# Patient Record
Sex: Female | Born: 1970 | Race: White | Hispanic: Yes | Marital: Single | Smoking: Never smoker
Health system: Southern US, Community
[De-identification: ages and names within clinical notes are randomized; demographics above are authoritative.]

## PROBLEM LIST (undated history)

## (undated) DIAGNOSIS — D649 Anemia, unspecified: Secondary | ICD-10-CM

## (undated) DIAGNOSIS — F209 Schizophrenia, unspecified: Secondary | ICD-10-CM

---

## 2012-02-06 ENCOUNTER — Emergency Department: Payer: Self-pay | Admitting: Emergency Medicine

## 2012-02-06 LAB — SALICYLATE LEVEL: Salicylates, Serum: <1.7 mg/dL

## 2012-02-06 LAB — DRUG SCREEN, URINE
Cannabinoid 50 Ng, Ur ~~LOC~~: NEGATIVE (ref ?–50)
Cocaine Metabolite,Ur ~~LOC~~: NEGATIVE (ref ?–300)
MDMA (Ecstasy)Ur Screen: NEGATIVE (ref ?–500)
Opiate, Ur Screen: NEGATIVE (ref ?–300)

## 2012-02-06 LAB — COMPREHENSIVE METABOLIC PANEL
Albumin: 4 g/dL (ref 3.4–5.0)
Alkaline Phosphatase: 71 U/L (ref 50–136)
Anion Gap: 10 (ref 7–16)
BUN: 14 mg/dL (ref 7–18)
Co2: 20 mmol/L — ABNORMAL LOW (ref 21–32)
Creatinine: 0.66 mg/dL (ref 0.60–1.30)
SGPT (ALT): 26 U/L (ref 12–78)
Sodium: 141 mmol/L (ref 136–145)

## 2012-02-06 LAB — CBC
HCT: 31 % — ABNORMAL LOW (ref 35.0–47.0)
HGB: 10.3 g/dL — ABNORMAL LOW (ref 12.0–16.0)
MCV: 65 fL — ABNORMAL LOW (ref 80–100)
RDW: 16.2 % — ABNORMAL HIGH (ref 11.5–14.5)
WBC: 8.3 10*3/uL (ref 3.6–11.0)

## 2012-02-06 LAB — ETHANOL: Ethanol %: <0.003 % (ref 0.000–0.080)

## 2012-02-07 ENCOUNTER — Emergency Department (HOSPITAL_COMMUNITY)
Admission: EM | Admit: 2012-02-07 | Discharge: 2012-02-08 | Disposition: A | Discharge status: Home / Self Care | Payer: Self-pay | Attending: Psychiatry | Admitting: Psychiatry

## 2012-02-07 ENCOUNTER — Encounter (HOSPITAL_COMMUNITY): Payer: Self-pay

## 2012-02-07 DIAGNOSIS — Z79899 Other long term (current) drug therapy: Secondary | ICD-10-CM | POA: Insufficient documentation

## 2012-02-07 DIAGNOSIS — F209 Schizophrenia, unspecified: Secondary | ICD-10-CM | POA: Diagnosis present

## 2012-02-07 DIAGNOSIS — D649 Anemia, unspecified: Secondary | ICD-10-CM | POA: Insufficient documentation

## 2012-02-07 HISTORY — DX: Schizophrenia, unspecified: F20.9

## 2012-02-07 HISTORY — DX: Anemia, unspecified: D64.9

## 2012-02-07 LAB — ETHANOL: Alcohol, Ethyl (B): <11 mg/dL (ref 0–11)

## 2012-02-07 LAB — CBC WITH DIFFERENTIAL/PLATELET
Basophils Relative: 0 % (ref 0–1)
Eosinophils Relative: 0 % (ref 0–5)
HCT: 32 % — ABNORMAL LOW (ref 36.0–46.0)
Hemoglobin: 9.9 g/dL — ABNORMAL LOW (ref 12.0–15.0)
Lymphs Abs: 1.8 10*3/uL (ref 0.7–4.0)
MCH: 20.6 pg — ABNORMAL LOW (ref 26.0–34.0)
MCHC: 30.9 g/dL (ref 30.0–36.0)
MCV: 66.5 fL — ABNORMAL LOW (ref 78.0–100.0)
Monocytes Absolute: 0.4 10*3/uL (ref 0.1–1.0)
Neutro Abs: 5.1 10*3/uL (ref 1.7–7.7)
Neutrophils Relative %: 70 % (ref 43–77)
RBC: 4.81 MIL/uL (ref 3.87–5.11)

## 2012-02-07 LAB — COMPREHENSIVE METABOLIC PANEL
ALT: 15 U/L (ref 0–35)
BUN: 13 mg/dL (ref 6–23)
CO2: 20 mEq/L (ref 19–32)
Calcium: 9.4 mg/dL (ref 8.4–10.5)
GFR calc Af Amer: >90 mL/min (ref >90)
GFR calc non Af Amer: >90 mL/min (ref >90)
Glucose, Bld: 100 mg/dL — ABNORMAL HIGH (ref 70–99)
Sodium: 136 mEq/L (ref 135–145)

## 2012-02-07 LAB — RAPID URINE DRUG SCREEN, HOSP PERFORMED
Benzodiazepines: NOT DETECTED
Opiates: NOT DETECTED

## 2012-02-07 MED ORDER — IBUPROFEN 600 MG PO TABS: 600.0000 mg | ORAL_TABLET | Freq: Three times a day (TID) | ORAL | Status: DC | PRN

## 2012-02-07 MED ORDER — ONDANSETRON HCL 4 MG PO TABS: 4.0000 mg | ORAL_TABLET | Freq: Three times a day (TID) | ORAL | Status: DC | PRN

## 2012-02-07 MED ORDER — ACETAMINOPHEN 325 MG PO TABS
650.0000 mg | ORAL_TABLET | ORAL | Status: DC | PRN
  Administered 2012-02-07 – 2012-02-08 (×2): 650 mg via ORAL
  Filled 2012-02-07 (×2): qty 2

## 2012-02-07 MED ORDER — ZOLPIDEM TARTRATE 5 MG PO TABS: 5.0000 mg | ORAL_TABLET | Freq: Every evening | ORAL | Status: DC | PRN

## 2012-02-07 MED ORDER — LORAZEPAM 1 MG PO TABS: 1.0000 mg | ORAL_TABLET | Freq: Three times a day (TID) | ORAL | Status: DC | PRN

## 2012-02-07 NOTE — ED Notes (Signed)
Patient has a history of schizophrenia. Patient is having auditory hallucinations and the voices are telling her to hurt herself and talk to her about suicide. Patient's sister reports that the patient frequently tells lies, having aggressive behavior, and paranoid thoughts. 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 Patient verbally states that she wants to hurt herself, but then she will state she doesn't want to hurt herself in the next sentence.

## 2012-02-07 NOTE — ED Notes (Signed)
Patient's sister-Arasely   (cell) [phone]   (home) [phone]

## 2012-02-07 NOTE — ED Notes (Signed)
Pt states "my sister & her girlfriend tell me I'm a liar, the voices tell me I'm lying, I have a lot of pressure in my head, moved here from [place] 2 months ago, my sister takes my things away when I don't do my chores, my other family doesn't have anything to do with me, my sister and her girlfriend, her girlfriend says 'why are you being good to your sister"

## 2012-02-07 NOTE — ED Provider Notes (Signed)
History      CSN: [id number]  Arrival date & time 02/07/12  1530   First MD Initiated Contact with Patient 02/07/12 1658      Chief Complaint  Patient presents with  . Medical Clearance  . Suicidal    HPI Patient presents because of auditory hallucinations telling her to injure herself and to consider suicide.  Patient's sister is present who reports a long-standing history of paranoid thoughts.  Mild MR.  The patient reports that she wants to herself but states that she doesn't want to die.  The patient is a known history of schizophrenia and anemia.  Her symptoms are mild to moderate in severity.  No other complaints.   Past Medical History  Diagnosis Date  . Schizophrenia   . Anemia     History reviewed. No pertinent past surgical history.  No family history on file.  History  Substance Use Topics  . Smoking status: Never Smoker   . Smokeless tobacco: Not on file  . Alcohol Use: No      Review of Systems  All other systems reviewed and are negative.    Allergies  Review of patient's allergies indicates no known allergies.  Home Medications   Current Outpatient Rx  Name Route Sig Dispense Refill  . ARIPIPRAZOLE 15 MG PO TABS Oral Take 15 mg by mouth daily.    Marland Kitchen CLOZAPINE 25 MG PO TABS Oral Take 25 mg by mouth daily.    Marland Kitchen FLUOXETINE HCL 20 MG PO CAPS Oral Take 20 mg by mouth daily.      BP 108/56  Pulse 107  Temp 98.6 F (37 C) (Oral)  Resp 20  LMP 01/08/2012  Physical Exam  Nursing note and vitals reviewed. Constitutional: She is oriented to person, place, and time. She appears well-developed and well-nourished. No distress.  HENT:  Head: Normocephalic and atraumatic.  Eyes: EOM are normal.  Neck: Normal range of motion.  Cardiovascular: Normal rate, regular rhythm and normal heart sounds.   Pulmonary/Chest: Effort normal and breath sounds normal.  Abdominal: Soft. She exhibits no distension. There is no tenderness.  Musculoskeletal: Normal  range of motion.  Neurological: She is alert and oriented to person, place, and time.  Skin: Skin is warm and dry.  Psychiatric: She has a normal mood and affect. Judgment normal.    ED Course  Procedures (including critical care time)   Labs Reviewed  CBC WITH DIFFERENTIAL - Abnormal; Notable for the following:    Hemoglobin 9.9 (*)     HCT 32.0 (*)     MCV 66.5 (*)     MCH 20.6 (*)     All other components within normal limits  COMPREHENSIVE METABOLIC PANEL - Abnormal; Notable for the following:    Glucose, Bld 100 (*)     Total Bilirubin 0.2 (*)     All other components within normal limits  URINE RAPID DRUG SCREEN (HOSP PERFORMED)  ETHANOL  LAB REPORT - SCANNED   No results found.   1. Schizophrenia       MDM           Lyanne Co, MD 03/01/12 [phone]

## 2012-02-08 DIAGNOSIS — F209 Schizophrenia, unspecified: Secondary | ICD-10-CM

## 2012-02-08 DIAGNOSIS — F70 Mild intellectual disabilities: Secondary | ICD-10-CM | POA: Insufficient documentation

## 2012-02-08 NOTE — ED Notes (Signed)
Pt's sister on phone stating "can I pick her up tomorrow, I just took a sleeping pill"; informed sister after d/c'd needs to be p/u'd, can send her in a taxi & pay upon arrival, sister then stated "I will just come get her"

## 2012-02-08 NOTE — BH Assessment (Signed)
Assessment Note   Tamara Dominguez is an 41 y.o. female. Pt presents with AH of voices telling her to hurt herself. Pt denies currently having AH at time of assessment. Per pt, "I have problems communicating all my thoughts because I am retarded". Pt also stated "I do this with my hands (opening and clenching fists) but I'm not going to hit". Pt denies SI, HI or current AH. Stated "I just want to get into a program where they can take care of me. Like at [facility] explained was part of the [facility] program in [place] where she had a Therapist, sports, therapist and day programs. Pt does not have OPT or support since being her in . Reports that her sister's girlfriend "calls me a liar and won't let me sleep. She wakes me up all the time. She says my sister is too soft on me, but I know my sister loves me, she is my guardian. I have a paper that says she has to take care of me. I feel hurt that my sister's girl is doing this to me." Pt described being set up with in-home services in [place] where an RN that would come to the home to check on her medications, but does not have health care providers here. Stated she has been taking the medications, which help to control the voices. Will need to get additional information from sister, who is apparently legal guardian.  Axis I: Schizophrenia, per pt report/history Axis II: r/o Mild MR - per pt report Axis III:  Past Medical History  Diagnosis Date  . Schizophrenia   . Anemia    Axis IV: other psychosocial or environmental problems, problems with access to health care services and problems with primary support group Axis V: 41-50 serious symptoms  Past Medical History:  Past Medical History  Diagnosis Date  . Schizophrenia   . Anemia     History reviewed. No pertinent past surgical history.  Family History: No family history on file.  Social History:  reports that she has never smoked. She does not have any smokeless tobacco history on file. She reports  that she does not drink alcohol or use illicit drugs.  Additional Social History:  Alcohol / Drug Use Pain Medications: N/A Prescriptions: See PTA Listing Over the Counter: N/A History of alcohol / drug use?: No history of alcohol / drug abuse Longest period of sobriety (when/how long): N/A  CIWA: CIWA-Ar BP: 137/88 mmHg Pulse Rate: 101  COWS:    Allergies: No Known Allergies  Home Medications:  (Not in a hospital admission)  OB/GYN Status:  Patient's last menstrual period was 01/08/2012.  General Assessment Data Location of Assessment: [facility] Living Arrangements: Other relatives;Non-relatives/Friends (Sister & sister's Girlfriend) Can pt return to current living arrangement?: Yes Admission Status: Voluntary Is patient capable of signing voluntary admission?: No (Pt's sister is guardian) Transfer from: Acute Hospital Referral Source: Self/Family/Friend  Education Status Is patient currently in school?: No Contact person: Landscape architect, sister  Risk to self Suicidal Ideation: No-Not Currently/Within Last 6 Months (Pt denies) Suicidal Intent: No-Not Currently/Within Last 6 Months (Pt denies) Is patient at risk for suicide?: No Suicidal Plan?: No-Not Currently/Within Last 6 Months (Pt denies) Access to Means: No What has been your use of drugs/alcohol within the last 12 months?: N/A Previous Attempts/Gestures: No (Pt denies) Other Self Harm Risks: None Triggers for Past Attempts: None known Intentional Self Injurious Behavior: None Family Suicide History: No Recent stressful life event(s): Other (Comment) (Moved from  [place] in August) Persecutory voices/beliefs?: Yes Depression: Yes Depression Symptoms: Fatigue;Feeling angry/irritable Substance abuse history and/or treatment for substance abuse?: No Suicide prevention information given to non-admitted patients: Not applicable  Risk to Others Homicidal Ideation: No-Not Currently/Within Last 6 Months (Pt  denies) Thoughts of Harm to Others: No-Not Currently Present/Within Last 6 Months (Pt denies) Current Homicidal Intent: No-Not Currently/Within Last 6 Months Current Homicidal Plan: No-Not Currently/Within Last 6 Months Access to Homicidal Means: No Identified Victim: N/A History of harm to others?: No Assessment of Violence: None Noted Violent Behavior Description: Calm, cooperative, pleasant Does patient have access to weapons?: No Criminal Charges Pending?: No Does patient have a court date: No  Psychosis Hallucinations: Auditory (denies currently) Delusions: None noted  Mental Status Report Appear/Hygiene: Disheveled Eye Contact: Good Motor Activity: Freedom of movement Speech: Logical/coherent Level of Consciousness: Quiet/awake Mood: Sad Affect: Sad Anxiety Level: Minimal Thought Processes: Coherent;Relevant Judgement: Impaired Orientation: Person;Place;Time;Situation Obsessive Compulsive Thoughts/Behaviors: None  Cognitive Functioning Concentration: Normal Memory: Recent Intact;Remote Intact IQ: Below Average Level of Function:  (Pt reports she is "retarded") Insight: Fair Impulse Control: Fair Appetite: Good Weight Loss: 0  Weight Gain: 0  Sleep: Decreased (states sister's GF wakes her up all the time) Vegetative Symptoms: None  ADLScreening [facility]) Patient's cognitive ability adequate to safely complete daily activities?: Yes Patient able to express need for assistance with ADLs?: Yes Independently performs ADLs?: Yes (appropriate for developmental age)  Abuse/Neglect [facility]) Physical Abuse: Denies Verbal Abuse: Denies Sexual Abuse: Denies  Prior Inpatient Therapy Prior Inpatient Therapy: Yes (pt poor historian)  Prior Outpatient Therapy Prior Outpatient Therapy: Yes Prior Therapy Dates: 2013 prior to move in August Prior Therapy Facilty/Provider(s): [place] - [facility] Reason for Treatment: MMR; Depression  ADL Screening (condition at  time of admission) Patient's cognitive ability adequate to safely complete daily activities?: Yes Patient able to express need for assistance with ADLs?: Yes Independently performs ADLs?: Yes (appropriate for developmental age)       Abuse/Neglect Assessment (Assessment to be complete while patient is alone) Physical Abuse: Denies Verbal Abuse: Denies Sexual Abuse: Denies Exploitation of patient/patient's resources: Denies Self-Neglect: Denies Values / Beliefs Cultural Requests During Hospitalization: None Spiritual Requests During Hospitalization: None   Advance Directives (For Healthcare) Advance Directive: Patient does not have advance directive;Patient would not like information Pre-existing out of facility DNR order (yellow form or pink MOST form): No    Additional Information 1:1 In Past 12 Months?: No CIRT Risk: No Elopement Risk: No Does patient have medical clearance?: Yes     Disposition:     On Site Evaluation by:   Reviewed with Physician:     Romeo Apple 02/08/2012 11:49 AM

## 2012-02-08 NOTE — ED Notes (Signed)
Tamara Dominguez, ACT informed sister is to p/u pt.

## 2012-02-08 NOTE — ED Notes (Signed)
While picking up breakfast tray patient states she feels dizzy. Notified RN, Arboriculturist.

## 2012-02-08 NOTE — ED Notes (Signed)
Pt states "the voices aren't there today, I just feel dizzy when I get up"; informed pt when sitting up to sit up slowly and sit there for a moment; pt verbalized understanding.

## 2012-02-08 NOTE — ED Notes (Signed)
Pt attempting to call for ride

## 2012-02-08 NOTE — BHH Counselor (Signed)
Upon psychiatric recommendation and evaluation, pt is to be discharged with instructions to follow up with community & OPT resources.  Updated EDP who was agreeable with d/c.

## 2012-02-08 NOTE — ED Notes (Signed)
Pt provided with other resources from [place] with ACT.

## 2012-02-08 NOTE — Consult Note (Signed)
Reason for Consult: psychosis Referring Physician: [name] is an 41 y.o. female.  HPI: Patient was seen and chart reviewed. Patient was relocated from [place], [place] to [place], few months ago along with her sister and sisters girlfriend. Patient has been diagnosed with the schizophrenia, and mild mental retardation, and has received psychiatric services as outpatient the past. Patient has been receiving her medications from her previous providers and does not have any local providers. Patient denied current symptoms of for psychosis auditory or visual hallucinations, delusions, or paranoia. She also denied current suicidal ideation, homicidal ideation, intentions and plans. Pt denies currently having AH at time of assessment. Stated "I just want to get into a program where they can take care of me. Like at [facility] explained was part of the [place] program in [place] where she had a Therapist, sports, therapist and day programs. Reports that her sister's girlfriend "calls me a liar and won't let me sleep. Patient sister is her guardian and the who was soft on her. She has been taking the medications, which help to control the voices.   Patient appeared in her bed who is calm, quite cooperative, not in distress. Patient denied the psychotic symptoms with symptoms of depression, anxiety. Patient has no suicidal, homicidal ideation intentions or plans.  Past Medical History  Diagnosis Date  . Schizophrenia   . Anemia     History reviewed. No pertinent past surgical history.  No family history on file.  Social History:  reports that she has never smoked. She does not have any smokeless tobacco history on file. She reports that she does not drink alcohol or use illicit drugs.  Allergies: No Known Allergies  Medications: I have reviewed the patient's current medications.  Results for orders placed during the hospital encounter of 02/07/12 (from the past 48 hour(s))  CBC WITH  DIFFERENTIAL     Status: Abnormal   Collection Time   02/07/12  4:55 PM      Component Value Range Comment   WBC 7.3  4.0 - 10.5 K/uL    RBC 4.81  3.87 - 5.11 MIL/uL    Hemoglobin 9.9 (*) 12.0 - 15.0 g/dL    HCT 16.1 (*) 09.6 - 46.0 %    MCV 66.5 (*) 78.0 - 100.0 fL    MCH 20.6 (*) 26.0 - 34.0 pg    MCHC 30.9  30.0 - 36.0 g/dL    RDW 04.5  40.9 - 81.1 %    Platelets 348  150 - 400 K/uL    Neutrophils Relative 70  43 - 77 %    Lymphocytes Relative 24  12 - 46 %    Monocytes Relative 6  3 - 12 %    Eosinophils Relative 0  0 - 5 %    Basophils Relative 0  0 - 1 %    Neutro Abs 5.1  1.7 - 7.7 K/uL    Lymphs Abs 1.8  0.7 - 4.0 K/uL    Monocytes Absolute 0.4  0.1 - 1.0 K/uL    Eosinophils Absolute 0.0  0.0 - 0.7 K/uL    Basophils Absolute 0.0  0.0 - 0.1 K/uL    Smear Review MORPHOLOGY UNREMARKABLE     COMPREHENSIVE METABOLIC PANEL     Status: Abnormal   Collection Time   02/07/12  4:55 PM      Component Value Range Comment   Sodium 136  135 - 145 mEq/L    Potassium 3.7  3.5 - 5.1 mEq/L    Chloride 103  96 - 112 mEq/L    CO2 20  19 - 32 mEq/L    Glucose, Bld 100 (*) 70 - 99 mg/dL    BUN 13  6 - 23 mg/dL    Creatinine, Ser 1.61  0.50 - 1.10 mg/dL    Calcium 9.4  8.4 - 09.6 mg/dL    Total Protein 7.6  6.0 - 8.3 g/dL    Albumin 3.9  3.5 - 5.2 g/dL    AST 17  0 - 37 U/L    ALT 15  0 - 35 U/L    Alkaline Phosphatase 68  39 - 117 U/L    Total Bilirubin 0.2 (*) 0.3 - 1.2 mg/dL    GFR calc non Af Amer >90  >90 mL/min    GFR calc Af Amer >90  >90 mL/min   ETHANOL     Status: Normal   Collection Time   02/07/12  4:55 PM      Component Value Range Comment   Alcohol, Ethyl (B) <11  0 - 11 mg/dL   URINE RAPID DRUG SCREEN (HOSP PERFORMED)     Status: Normal   Collection Time   02/07/12 10:52 PM      Component Value Range Comment   Opiates NONE DETECTED  NONE DETECTED    Cocaine NONE DETECTED  NONE DETECTED    Benzodiazepines NONE DETECTED  NONE DETECTED    Amphetamines NONE  DETECTED  NONE DETECTED    Tetrahydrocannabinol NONE DETECTED  NONE DETECTED    Barbiturates NONE DETECTED  NONE DETECTED     No results found.  No depression, No anxiety, No psychosis and Positive for Schizophrenia mild mental retardation, recent relocation need referral to the mental health services. Blood pressure 128/91, pulse 116, temperature 98.7 F (37.1 C), temperature source Oral, resp. rate 18, last menstrual period 01/08/2012, SpO2 99.00%.   Assessment/Plan: Schizophrenia and differentiated Mild mental retardation as per the patient report.  Patient does not meet criteria for acute psychiatric hospitalization. Patient will be referred to outpatient psychiatric services with the local resources. No medication changes made during this emergency psychiatric services Visit.  Tamara Dominguez,Tamara R. 02/08/2012, 4:18 PM

## 2012-02-08 NOTE — BHH Suicide Risk Assessment (Signed)
Suicide Risk Assessment  Discharge Assessment     Demographic Factors:  Caucasian, Low socioeconomic status and Unemployed  Mental Status Per Nursing Assessment::   On Admission:     Current Mental Status by Physician: Denies psychotic symptoms, suicidal and homicidal ideation. Endorses conflict with his sister's girlfriend.  Loss Factors: Recent relocation, and the conflict with the sister's girlfriend.  Historical Factors: Family history of mental illness or substance abuse  Risk Reduction Factors:   Sense of responsibility to family, Religious beliefs about death, Living with another person, especially a relative, Positive social support and Positive therapeutic relationship  Continued Clinical Symptoms:  Previous Psychiatric Diagnoses and Treatments Medical Diagnoses and Treatments/Surgeries  Cognitive Features That Contribute To Risk:  Closed-mindedness Loss of executive function    Suicide Risk:  Minimal: No identifiable suicidal ideation.  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 Schizophrenia   . Anemia    AXIS IV:  economic problems, educational problems, other psychosocial or environmental problems, problems related to social environment and problems with primary support group AXIS V:  61-70 mild symptoms  Plan Of Care/Follow-up recommendations:  Activity:  Normal Diet:  Regular  Is patient on multiple antipsychotic therapies at discharge:  No   Has Patient had three or more failed trials of antipsychotic monotherapy by history:  No  Recommended Plan for Multiple Antipsychotic Therapies: Not applicable  Dezi Schaner,[name] 02/08/2012, 4:35 PM

## 2012-03-14 ENCOUNTER — Emergency Department (HOSPITAL_COMMUNITY)
Admission: EM | Admit: 2012-03-14 | Discharge: 2012-03-14 | Disposition: A | Discharge status: Home / Self Care | Payer: Medicare Other | Attending: Emergency Medicine | Admitting: Emergency Medicine

## 2012-03-14 ENCOUNTER — Encounter (HOSPITAL_COMMUNITY): Payer: Self-pay | Admitting: Emergency Medicine

## 2012-03-14 DIAGNOSIS — F3289 Other specified depressive episodes: Secondary | ICD-10-CM | POA: Insufficient documentation

## 2012-03-14 DIAGNOSIS — Z79899 Other long term (current) drug therapy: Secondary | ICD-10-CM | POA: Insufficient documentation

## 2012-03-14 DIAGNOSIS — R443 Hallucinations, unspecified: Secondary | ICD-10-CM | POA: Insufficient documentation

## 2012-03-14 DIAGNOSIS — H5316 Psychophysical visual disturbances: Secondary | ICD-10-CM | POA: Insufficient documentation

## 2012-03-14 DIAGNOSIS — F70 Mild intellectual disabilities: Secondary | ICD-10-CM

## 2012-03-14 DIAGNOSIS — F329 Major depressive disorder, single episode, unspecified: Secondary | ICD-10-CM | POA: Insufficient documentation

## 2012-03-14 DIAGNOSIS — F205 Residual schizophrenia: Secondary | ICD-10-CM

## 2012-03-14 DIAGNOSIS — Z862 Personal history of diseases of the blood and blood-forming organs and certain disorders involving the immune mechanism: Secondary | ICD-10-CM | POA: Insufficient documentation

## 2012-03-14 DIAGNOSIS — Z3202 Encounter for pregnancy test, result negative: Secondary | ICD-10-CM | POA: Insufficient documentation

## 2012-03-14 DIAGNOSIS — F209 Schizophrenia, unspecified: Secondary | ICD-10-CM | POA: Insufficient documentation

## 2012-03-14 LAB — COMPREHENSIVE METABOLIC PANEL
ALT: 22 U/L (ref 0–35)
Alkaline Phosphatase: 70 U/L (ref 39–117)
CO2: 22 mEq/L (ref 19–32)
GFR calc Af Amer: >90 mL/min (ref >90)
GFR calc non Af Amer: >90 mL/min (ref >90)
Glucose, Bld: 98 mg/dL (ref 70–99)
Potassium: 3.5 mEq/L (ref 3.5–5.1)
Sodium: 136 mEq/L (ref 135–145)
Total Bilirubin: 0.2 mg/dL — ABNORMAL LOW (ref 0.3–1.2)

## 2012-03-14 LAB — CBC
Hemoglobin: 9.4 g/dL — ABNORMAL LOW (ref 12.0–15.0)
RBC: 4.56 MIL/uL (ref 3.87–5.11)

## 2012-03-14 LAB — RAPID URINE DRUG SCREEN, HOSP PERFORMED
Amphetamines: NOT DETECTED
Barbiturates: NOT DETECTED
Benzodiazepines: NOT DETECTED
Cocaine: NOT DETECTED
Opiates: NOT DETECTED
Tetrahydrocannabinol: NOT DETECTED

## 2012-03-14 LAB — ETHANOL: Alcohol, Ethyl (B): <11 mg/dL (ref 0–11)

## 2012-03-14 LAB — ACETAMINOPHEN LEVEL: Acetaminophen (Tylenol), Serum: <15 ug/mL (ref 10–30)

## 2012-03-14 NOTE — ED Notes (Signed)
Pt to ED with sister-in-law.  Per SIL, pt has been found talking to herself and when she is asked who she is talking to, she states she is talking to the spirits of her mother and father who are deceased.  Pt has told family that the spirits are telling her to kill herself and to kill the pt's sister.  Pt calm and cooperative in triage.  Admits to hearing voices.  Pt is moving her lips while in triage and states she is talking to her parents.

## 2012-03-14 NOTE — ED Provider Notes (Signed)
History     CSN: [id number]  Arrival date & time 03/14/12  1127   First MD Initiated Contact with Patient 03/14/12 1317      Chief Complaint  Patient presents with  . V70.1    (Consider location/radiation/quality/duration/timing/severity/associated sxs/prior treatment) HPI Pt states she came in because her family told her to. She states she has been seeing floating babies and hearing voices and has been violent with her family. She denied current SI/HI. Pt states hse has been compliant with meds. No physical complaints.  Past Medical History  Diagnosis Date  . Schizophrenia   . Anemia     History reviewed. No pertinent past surgical history.  History reviewed. No pertinent family history.  History  Substance Use Topics  . Smoking status: Never Smoker   . Smokeless tobacco: Not on file  . Alcohol Use: No    OB History    Grav Para Term Preterm Abortions TAB SAB Ect Mult Living                  Review of Systems  Constitutional: Negative for fever and chills.  Respiratory: Negative for shortness of breath.   Cardiovascular: Negative for chest pain.  Gastrointestinal: Negative for nausea, vomiting and abdominal pain.  Musculoskeletal: Negative for back pain.  Neurological: Negative for weakness and headaches.    Allergies  Review of patient's allergies indicates no known allergies.  Home Medications   Current Outpatient Rx  Name  Route  Sig  Dispense  Refill  . ARIPIPRAZOLE 30 MG PO TABS   Oral   Take 30 mg by mouth daily.         Marland Kitchen CLOZAPINE 100 MG PO TABS   Oral   Take 100-600 mg by mouth 2 (two) times daily. Pt takes 100 mg tablet in the morning and 600 mg at bedtime ( 6 tabs )         . FLUOXETINE HCL 20 MG PO CAPS   Oral   Take 20 mg by mouth daily.           BP 136/95  Pulse 106  Temp 97.9 F (36.6 C) (Oral)  Resp 18  SpO2 99%  LMP 03/11/2012  Physical Exam  Nursing note and vitals reviewed. Constitutional: She is oriented to  person, place, and time. She appears well-developed and well-nourished. No distress.  HENT:  Head: Normocephalic and atraumatic.  Mouth/Throat: Oropharynx is clear and moist.  Eyes: EOM are normal. Pupils are equal, round, and reactive to light.  Neck: Normal range of motion. Neck supple.       No meningismus   Cardiovascular: Normal rate and regular rhythm.   Pulmonary/Chest: Effort normal and breath sounds normal. No respiratory distress. She has no wheezes. She has no rales. She exhibits no tenderness.  Abdominal: Soft. Bowel sounds are normal. She exhibits no distension and no mass. There is no tenderness. There is no rebound and no guarding.  Musculoskeletal: Normal range of motion. She exhibits no edema and no tenderness.  Neurological: She is alert and oriented to person, place, and time.       5/5 motor in all ext, sensation intact  Skin: Skin is warm and dry. No rash noted. No erythema.  Psychiatric:       Responding to external stimuli. No SI/HI.     ED Course  Procedures (including critical care time)  Labs Reviewed  CBC - Abnormal; Notable for the following:    Hemoglobin 9.4 (*)  HCT 30.3 (*)     MCV 66.4 (*)     MCH 20.6 (*)     RDW 16.7 (*)     All other components within normal limits  COMPREHENSIVE METABOLIC PANEL - Abnormal; Notable for the following:    Total Bilirubin 0.2 (*)     All other components within normal limits  SALICYLATE LEVEL - Abnormal; Notable for the following:    Salicylate Lvl <2.0 (*)     All other components within normal limits  ACETAMINOPHEN LEVEL  ETHANOL  URINE RAPID DRUG SCREEN (HOSP PERFORMED)  POCT PREGNANCY, URINE   No results found.   1. Hallucination       MDM  ACT to eval.         Loren Racer, MD 03/14/12 1539

## 2012-03-14 NOTE — ED Notes (Signed)
Per Dr. Shela Commons, pt does not meet criteria for inpatient admit.  Since pt is MR, sister will need to pick pt up for discharge.  Currently waiting for contact with sister.
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
## 2012-03-14 NOTE — ED Notes (Signed)
Act team and Dr. Shela Commons in with pt.
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
## 2012-03-14 NOTE — ED Notes (Signed)
Patient pleasant and cooperative with staff; no s/s of distress noted. Pt denies SI/HI.

## 2012-03-14 NOTE — Progress Notes (Signed)
CSW left resources for pt with RN, if pt sister arrives while csw is unavailable.   [name], LCSWA  [phone] 03/14/2012 20:35pm

## 2012-03-14 NOTE — ED Notes (Signed)
Pt reports that she is here because "they are telling me this and that".  When asked pt if "this and that" are voices, she stated yes.  Pt stated one minute the voices are telling her she's the devil.  During conversation, pt laughed inappropriately throughout conversation.  Pt stated that voices are not currently telling her to harm herself or anyone else.  Pt also stated she is slightly MR.  Pt is currently calm and cooperative.

## 2012-03-14 NOTE — ED Notes (Signed)
MD at bedside. 
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
## 2012-03-14 NOTE — ED Notes (Signed)
Patient discharged under care of her sister via private vehicle. Pt's belongings returned to her; no s/s of distress noted at this time. Pt's sister verbalizes an understanding of discharge instructions.

## 2012-03-14 NOTE — Progress Notes (Addendum)
CSW received call back from [facility] department. Pt sister continues to refuse to come get patient and refusing patient to return home with pt sister after sheriff spoke with pt sister.  Per sheriff's department, pt sister is refusing to take this CSW calls.   CSW left message with [facility] for DSS Adult [employer]. CSW awaiting call back   .Doree Albee  [phone] 03/14/2012 9:59pm    Addendum 10:47pm   CSW completed APS report.   [name], [name]  [phone] .03/14/2012 1047pm

## 2012-03-14 NOTE — Progress Notes (Signed)
Pt sister arrived at the hospital to pick up patient. CSW pt and pt sister discussed follow up with outpatient psychiatric resources, clinical social work, and a list of group homes. Pt sister agreed to assist with pt follow up care.   When patient sister walked into patient room she was very demanding stating that patient needed to listen to her, not to talk back, and that she had to go to the doctor. Pt agreed.   CSW updated [place] aps, and aps plans to follow up with pt and pt sister.   [name], LCSWA  [phone] 03/14/2012 11:05pm

## 2012-03-15 LAB — COMPREHENSIVE METABOLIC PANEL
Albumin: 3.9 g/dL (ref 3.4–5.0)
Anion Gap: 6 — ABNORMAL LOW (ref 7–16)
Bilirubin,Total: 0.3 mg/dL (ref 0.2–1.0)
Chloride: 107 mmol/L (ref 98–107)
Co2: 25 mmol/L (ref 21–32)
Creatinine: 0.7 mg/dL (ref 0.60–1.30)
EGFR (African American): >60
SGOT(AST): 23 U/L (ref 15–37)
SGPT (ALT): 27 U/L (ref 12–78)
Total Protein: 7.7 g/dL (ref 6.4–8.2)

## 2012-03-15 LAB — DRUG SCREEN, URINE
Cocaine Metabolite,Ur ~~LOC~~: NEGATIVE (ref ?–300)
MDMA (Ecstasy)Ur Screen: NEGATIVE (ref ?–500)
Tricyclic, Ur Screen: NEGATIVE (ref ?–1000)

## 2012-03-15 LAB — CBC
HGB: 10.1 g/dL — ABNORMAL LOW (ref 12.0–16.0)
MCH: 20.5 pg — ABNORMAL LOW (ref 26.0–34.0)
MCV: 65 fL — ABNORMAL LOW (ref 80–100)
RBC: 4.96 10*6/uL (ref 3.80–5.20)

## 2012-03-15 LAB — T4, FREE: Free Thyroxine: 1.15 ng/dL (ref 0.76–1.46)

## 2012-03-15 LAB — URINALYSIS, COMPLETE
Protein: NEGATIVE
Squamous Epithelial: 7

## 2012-03-15 LAB — TSH: Thyroid Stimulating Horm: 0.371 u[IU]/mL — ABNORMAL LOW

## 2012-03-16 ENCOUNTER — Inpatient Hospital Stay: Payer: Self-pay | Admitting: Psychiatry

## 2012-03-16 LAB — DIFFERENTIAL
Basophil %: 0.9 %
Eosinophil %: 0 %
Lymphocyte #: 2.5 10*3/uL (ref 1.0–3.6)
Monocyte #: 0.5 x10 3/mm (ref 0.2–0.9)
Monocyte %: 6.6 %
Neutrophil #: 4.2 10*3/uL (ref 1.4–6.5)

## 2012-03-16 LAB — WBC: WBC: 7.2 10*3/uL (ref 3.6–11.0)

## 2012-03-23 LAB — DIFFERENTIAL
Basophil %: 0.7 %
Eosinophil %: 0 %
Lymphocyte %: 33.3 %
Neutrophil %: 59.9 %

## 2012-03-23 LAB — WBC: WBC: 7.5 10*3/uL (ref 3.6–11.0)

## 2012-03-28 LAB — URINALYSIS, COMPLETE
Bacteria: NONE SEEN
Bilirubin,UR: NEGATIVE
Glucose,UR: NEGATIVE mg/dL (ref 0–75)
Ketone: NEGATIVE
Leukocyte Esterase: NEGATIVE
Nitrite: NEGATIVE
Specific Gravity: 1.025 (ref 1.003–1.030)

## 2012-03-30 LAB — WBC: WBC: 7.4 10*3/uL (ref 3.6–11.0)

## 2012-03-30 LAB — DIFFERENTIAL
Basophil %: 0.4 %
Eosinophil #: 0 10*3/uL (ref 0.0–0.7)
Eosinophil %: 0 %
Lymphocyte #: 2.8 10*3/uL (ref 1.0–3.6)
Monocyte #: 0.5 x10 3/mm (ref 0.2–0.9)
Monocyte %: 6.2 %

## 2012-04-06 LAB — DIFFERENTIAL
Basophil #: 0 10*3/uL (ref 0.0–0.1)
Basophil %: 0.7 %
Eosinophil #: 0 10*3/uL (ref 0.0–0.7)
Eosinophil %: 0 %
Monocyte #: 0.5 x10 3/mm (ref 0.2–0.9)
Neutrophil %: 53.3 %

## 2013-04-01 IMAGING — CR DG CHEST 2V
1 series · 2 of 2 positions shown · non-contrast
Comparison: none

REASON FOR EXAM: Ro for TB
COMMENTS:

PROCEDURE:     DXR - DXR CHEST PA (OR AP) AND LATERAL  - April 09, 2012  [DATE]
RESULT:     Comparison: None.

[Series 1: pa · 0.17mm/px · 2 of 2 slices shown]
[im 1/2]
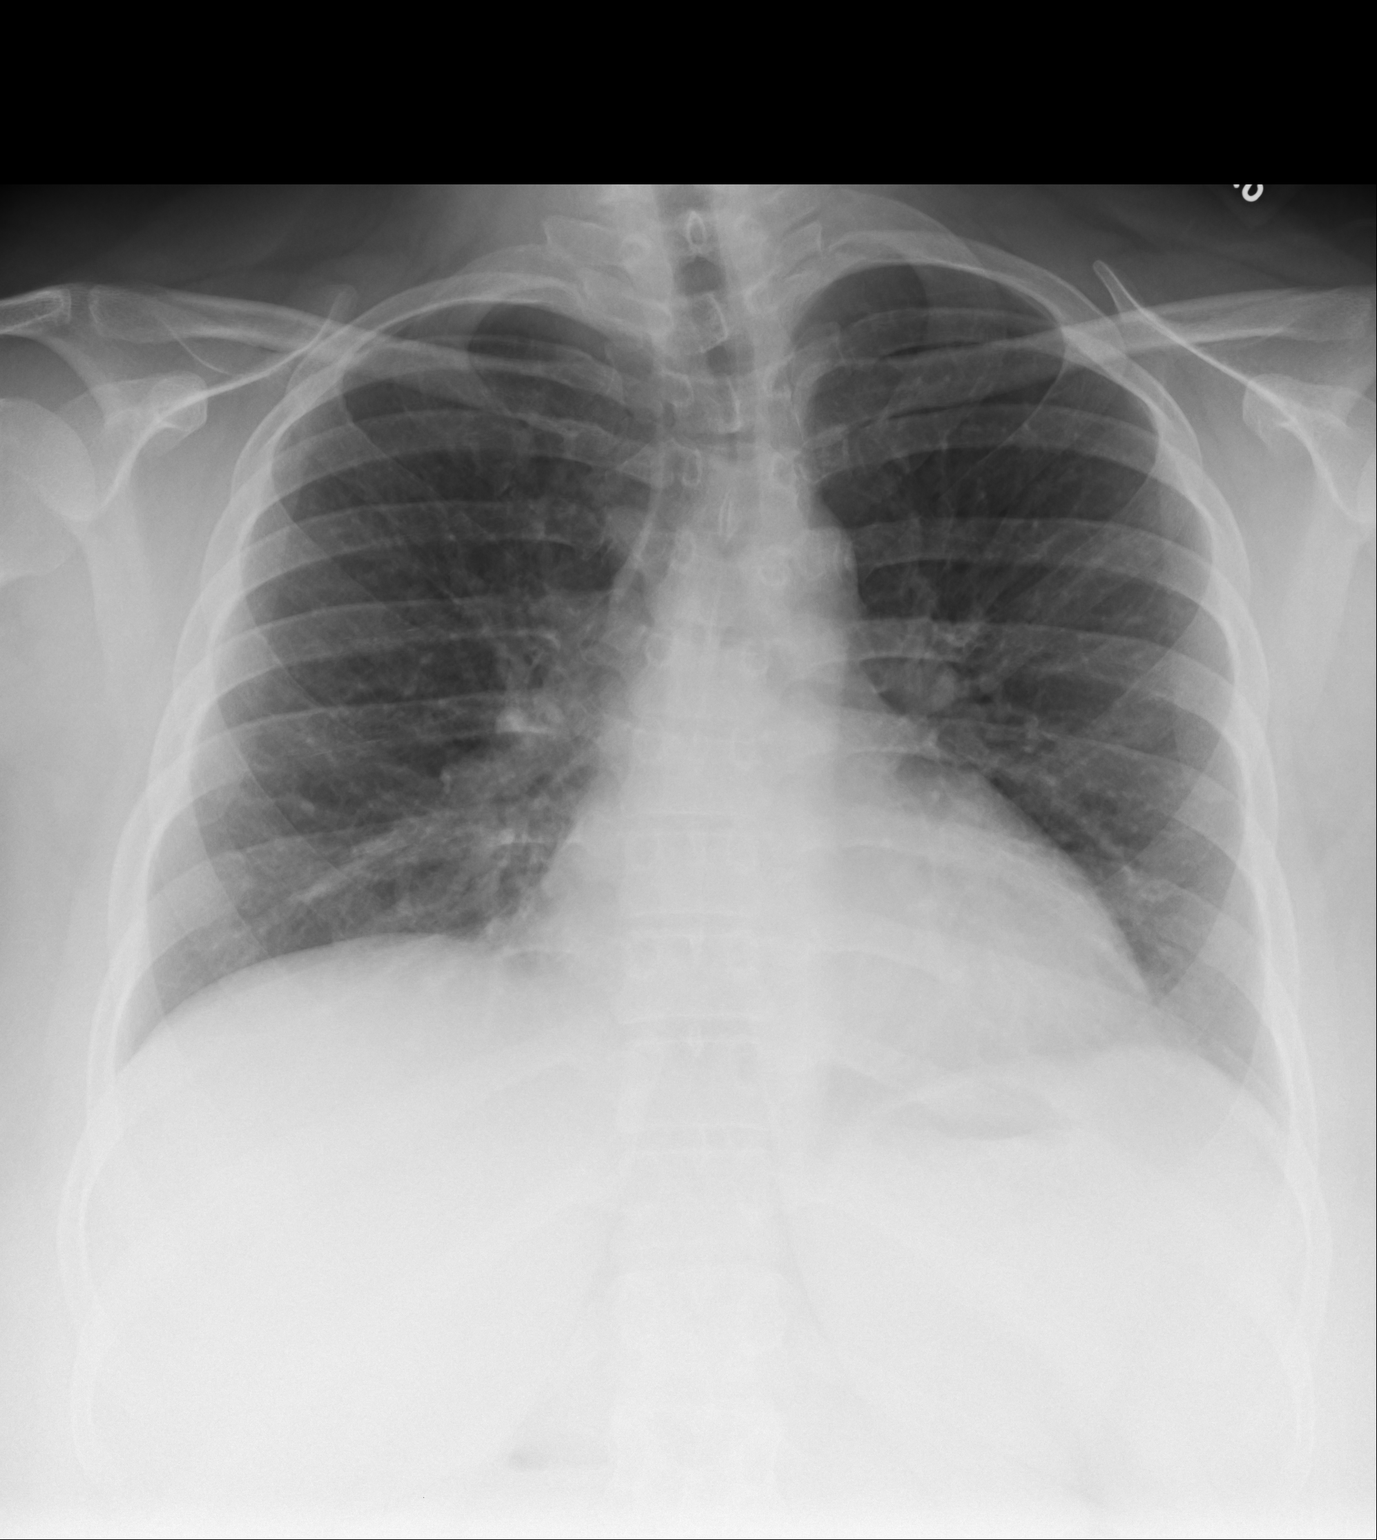
[im 2/2]
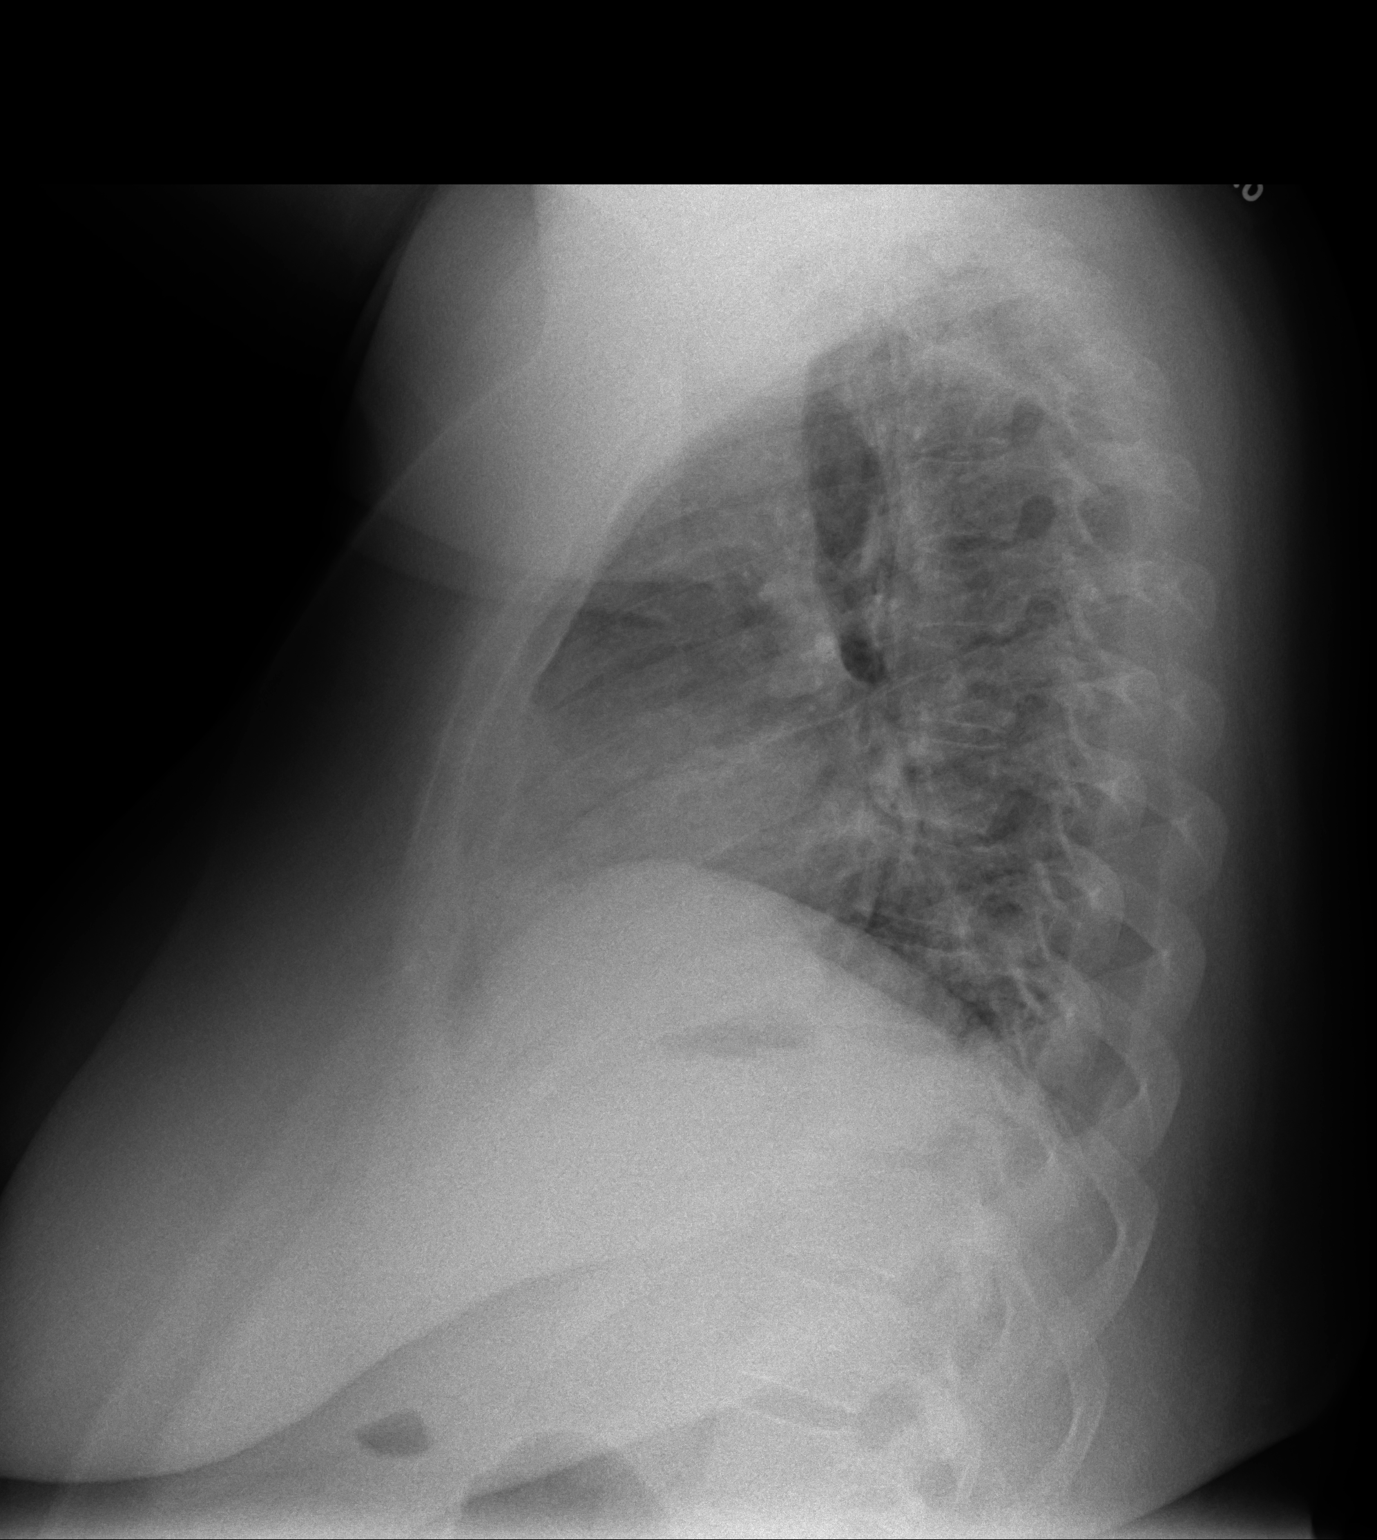

[2 of 2 positions shown; findings below may reference images not displayed]

FINDINGS: Heart size upper limits normal. The lung volumes are low. Mild basilar
opacities, right greater than left, are likely secondary to atelectasis.
IMPRESSION: Mild basilar opacities are likely secondary to atelectasis from the low lung
volumes. However, followup chest radiograph with improved inspiration is
suggested.

[REDACTED]

## 2014-07-29 NOTE — Consult Note (Signed)
Brief Consult Note: Diagnosis: schizophrenia.   Patient was seen by consultant.   Recommend further assessment or treatment.   Orders entered.   Comments: Psychiatry: Patient seen. Dx is schizophrenia. [facility] See full note H&P.  Electronic Signatures: Audery Amellapacs, John T (MD)  (Signed 06-Dec-13 12:54)  Authored: Brief Consult Note   Last Updated: 06-Dec-13 12:54 by Audery Amellapacs, John T (MD)
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
